# Patient Record
Sex: Female | Born: 1975 | Race: Black or African American | Hispanic: No | Marital: Single
Health system: Southern US, Community
[De-identification: ages and names within clinical notes are randomized; demographics above are authoritative.]

---

## 2004-04-21 ENCOUNTER — Emergency Department: Payer: Self-pay | Admitting: Emergency Medicine

## 2004-04-24 ENCOUNTER — Emergency Department (HOSPITAL_COMMUNITY): Admission: EM | Admit: 2004-04-24 | Discharge: 2004-04-24 | Payer: Self-pay | Admitting: Emergency Medicine

## 2004-05-24 ENCOUNTER — Emergency Department: Payer: Self-pay | Admitting: General Practice

## 2004-06-12 ENCOUNTER — Emergency Department: Payer: Self-pay | Admitting: Emergency Medicine

## 2004-06-19 ENCOUNTER — Emergency Department: Payer: Self-pay | Admitting: Emergency Medicine

## 2004-06-27 ENCOUNTER — Emergency Department (HOSPITAL_COMMUNITY): Admission: EM | Admit: 2004-06-27 | Discharge: 2004-06-28 | Payer: Self-pay | Admitting: Emergency Medicine

## 2004-09-04 ENCOUNTER — Other Ambulatory Visit: Payer: Self-pay

## 2004-09-04 ENCOUNTER — Inpatient Hospital Stay: Payer: Self-pay | Admitting: Internal Medicine

## 2004-11-29 ENCOUNTER — Emergency Department: Payer: Self-pay | Admitting: Emergency Medicine

## 2004-11-29 ENCOUNTER — Other Ambulatory Visit: Payer: Self-pay

## 2019-05-21 ENCOUNTER — Other Ambulatory Visit: Payer: Self-pay | Admitting: Obstetrics and Gynecology

## 2019-05-21 DIAGNOSIS — Z1231 Encounter for screening mammogram for malignant neoplasm of breast: Secondary | ICD-10-CM

## 2019-06-06 ENCOUNTER — Encounter (INDEPENDENT_AMBULATORY_CARE_PROVIDER_SITE_OTHER): Payer: Self-pay

## 2019-06-06 ENCOUNTER — Inpatient Hospital Stay: Payer: BLUE CROSS/BLUE SHIELD | Attending: Oncology | Admitting: Oncology

## 2019-06-06 ENCOUNTER — Other Ambulatory Visit: Payer: Self-pay

## 2019-06-06 ENCOUNTER — Inpatient Hospital Stay: Payer: BLUE CROSS/BLUE SHIELD

## 2019-06-06 VITALS — BP 132/79 | HR 73 | Temp 98.8°F | Wt 177.5 lb

## 2019-06-06 DIAGNOSIS — D509 Iron deficiency anemia, unspecified: Secondary | ICD-10-CM

## 2019-06-06 DIAGNOSIS — K5900 Constipation, unspecified: Secondary | ICD-10-CM | POA: Insufficient documentation

## 2019-06-06 LAB — CBC WITH DIFFERENTIAL/PLATELET
Abs Immature Granulocytes: 0.01 10*3/uL (ref 0.00–0.07)
Basophils Absolute: 0 10*3/uL (ref 0.0–0.1)
Basophils Relative: 0 %
Eosinophils Absolute: 0.1 10*3/uL (ref 0.0–0.5)
Eosinophils Relative: 2 %
HCT: 35.4 % — ABNORMAL LOW (ref 36.0–46.0)
Hemoglobin: 10.4 g/dL — ABNORMAL LOW (ref 12.0–15.0)
Immature Granulocytes: 0 %
Lymphocytes Relative: 39 %
Lymphs Abs: 2.1 10*3/uL (ref 0.7–4.0)
MCH: 23.9 pg — ABNORMAL LOW (ref 26.0–34.0)
MCHC: 29.4 g/dL — ABNORMAL LOW (ref 30.0–36.0)
MCV: 81.4 fL (ref 80.0–100.0)
Monocytes Absolute: 0.5 10*3/uL (ref 0.1–1.0)
Monocytes Relative: 9 %
Neutro Abs: 2.8 10*3/uL (ref 1.7–7.7)
Neutrophils Relative %: 50 %
Platelets: 281 10*3/uL (ref 150–400)
RBC: 4.35 MIL/uL (ref 3.87–5.11)
RDW: 15.3 % (ref 11.5–15.5)
WBC: 5.5 10*3/uL (ref 4.0–10.5)
nRBC: 0 % (ref 0.0–0.2)

## 2019-06-06 LAB — IRON AND TIBC
Iron: 46 ug/dL (ref 28–170)
Saturation Ratios: 11 % (ref 10.4–31.8)
TIBC: 428 ug/dL (ref 250–450)
UIBC: 382 ug/dL

## 2019-06-06 LAB — VITAMIN B12: Vitamin B-12: 537 pg/mL (ref 180–914)

## 2019-06-06 LAB — FOLATE: Folate: 6.3 ng/mL (ref >5.9)

## 2019-06-06 LAB — FERRITIN: Ferritin: 7 ng/mL — ABNORMAL LOW (ref 11–307)

## 2019-06-06 NOTE — Progress Notes (Signed)
[facility]. Questions and concerns gone over. Pt has no specific questions or concerns at this time.  

## 2019-06-07 DIAGNOSIS — D509 Iron deficiency anemia, unspecified: Secondary | ICD-10-CM | POA: Insufficient documentation

## 2019-06-07 NOTE — Progress Notes (Signed)
[facility]  Telephone:(336) [phone] [phone]) [phone]  ID: Charyl Dancer OB: [birth year]  MR#: [id number]  JKK#:[id number]  Patient Care Team: Schermerhorn, Ihor Austin, MD as PCP - General (Obstetrics and Gynecology)  CHIEF COMPLAINT: Iron deficiency anemia.  INTERVAL HISTORY: Patient is a 44 year old female was noted to have persistently decreased hemoglobin and ferritin levels.  She reports taking oral iron supplementation with no improvement of her blood work.  This also caused increased constipation.  She currently feels well and is asymptomatic.  She does not complain of weakness or fatigue.  She has no neurologic complaints.  She denies any recent fevers or illnesses.  She has a good appetite and denies weight loss.  She has no chest pain, shortness of breath, cough, or hemoptysis.  She denies any nausea, vomiting, constipation, or diarrhea.  She has no melena or hematochezia.  She has no urinary complaints.  Patient offers no specific complaints today.  REVIEW OF SYSTEMS:   Review of Systems  Constitutional: Negative.  Negative for fever, malaise/fatigue and weight loss.  Respiratory: Negative.  Negative for cough, hemoptysis and shortness of breath.   Cardiovascular: Negative.  Negative for chest pain and leg swelling.  Gastrointestinal: Negative.  Negative for abdominal pain, blood in stool and melena.  Genitourinary: Negative.  Negative for hematuria.  Musculoskeletal: Negative.  Negative for back pain.  Skin: Negative.  Negative for rash.  Neurological: Negative.  Negative for dizziness, focal weakness, weakness and headaches.  Psychiatric/Behavioral: Negative.  The patient is not nervous/anxious.     As per HPI. Otherwise, a complete review of systems is negative.  PAST MEDICAL HISTORY: No past medical history on file.  PAST SURGICAL HISTORY: No reported surgical history.  FAMILY HISTORY: No family history on file.  ADVANCED DIRECTIVES (Y/N):   N  HEALTH MAINTENANCE: Social History   Tobacco Use  . Smoking status: Not on file  Substance Use Topics  . Alcohol use: Not on file  . Drug use: Not on file     Colonoscopy:  PAP:  Bone density:  Lipid panel:  Allergies  Allergen Reactions  . Ceftriaxone Anaphylaxis    Current Outpatient Medications  Medication Sig Dispense Refill  . triamcinolone ointment (KENALOG) 0.1 % Apply topically 2 (two) times a week.     No current facility-administered medications for this visit.    OBJECTIVE: Vitals:   06/06/19 1341  BP: 132/79  Pulse: 73  Temp: 98.8 F (37.1 C)     There is no height or weight on file to calculate BMI.    ECOG FS:0 - Asymptomatic  General: Well-developed, well-nourished, no acute distress. Eyes: Pink conjunctiva, anicteric sclera. HEENT: Normocephalic, moist mucous membranes. Lungs: No audible wheezing or coughing. Heart: Regular rate and rhythm. Abdomen: Soft, nontender, no obvious distention. Musculoskeletal: No edema, cyanosis, or clubbing. Neuro: Alert, answering all questions appropriately. Cranial nerves grossly intact. Skin: No rashes or petechiae noted. Psych: Normal affect. Lymphatics: No cervical, calvicular, axillary or inguinal LAD.   LAB RESULTS:  No results found for: NA, K, CL, CO2, GLUCOSE, BUN, CREATININE, CALCIUM, PROT, ALBUMIN, AST, ALT, ALKPHOS, BILITOT, GFRNONAA, GFRAA  Lab Results  Component Value Date   WBC 5.5 06/06/2019   NEUTROABS 2.8 06/06/2019   HGB 10.4 (L) 06/06/2019   HCT 35.4 (L) 06/06/2019   MCV 81.4 06/06/2019   PLT 281 06/06/2019   Lab Results  Component Value Date   IRON 46 06/06/2019   TIBC 428 06/06/2019   IRONPCTSAT 11 06/06/2019  Lab Results  Component Value Date   FERRITIN 7 (L) 06/06/2019     STUDIES: No results found.  ASSESSMENT: Iron deficiency anemia.  PLAN:    1.  Iron deficiency anemia: Patient's hemoglobin and iron stores are persistently decreased and she has been  unresponsive to oral iron supplementation.  Patient return to clinic in 1 and 2 weeks to receive 510 mg IV Feraheme.  She would then return to clinic in 3 months with repeat laboratory work, further evaluation, and additional treatment if necessary.  I spent a total of 45 minutes reviewing chart data, face-to-face evaluation with the patient, counseling and coordination of care as detailed above.   Patient expressed understanding and was in agreement with this plan. She also understands that She can call clinic at any time with any questions, concerns, or complaints.    Lloyd Huger, MD   06/07/2019 9:01 AM

## 2019-06-10 LAB — HEMOGLOBINOPATHY EVALUATION
Hgb A2 Quant: 2 % (ref 1.8–3.2)
Hgb A: 98 % (ref 96.4–98.8)
Hgb C: 0 %
Hgb F Quant: 0 % (ref 0.0–2.0)
Hgb S Quant: 0 %
Hgb Variant: 0 %

## 2019-06-12 ENCOUNTER — Other Ambulatory Visit: Payer: Self-pay

## 2019-06-12 ENCOUNTER — Inpatient Hospital Stay: Payer: BLUE CROSS/BLUE SHIELD

## 2019-06-12 VITALS — BP 119/73 | HR 79 | Temp 97.5°F | Resp 18

## 2019-06-12 DIAGNOSIS — D509 Iron deficiency anemia, unspecified: Secondary | ICD-10-CM | POA: Diagnosis not present

## 2019-06-12 MED ORDER — SODIUM CHLORIDE 0.9 % IV SOLN
Freq: Once | INTRAVENOUS | Status: AC
  Filled 2019-06-12: qty 250

## 2019-06-12 MED ORDER — SODIUM CHLORIDE 0.9 % IV SOLN
510.0000 mg | Freq: Once | INTRAVENOUS | Status: AC
  Administered 2019-06-12: 510 mg via INTRAVENOUS
  Filled 2019-06-12: qty 17

## 2019-06-19 ENCOUNTER — Encounter: Payer: Self-pay | Admitting: Radiology

## 2019-06-19 ENCOUNTER — Ambulatory Visit
Admission: RE | Admit: 2019-06-19 | Discharge: 2019-06-19 | Disposition: A | Discharge status: Home / Self Care | Payer: BLUE CROSS/BLUE SHIELD | Source: Ambulatory Visit | Attending: Obstetrics and Gynecology | Admitting: Obstetrics and Gynecology

## 2019-06-19 DIAGNOSIS — Z1231 Encounter for screening mammogram for malignant neoplasm of breast: Secondary | ICD-10-CM | POA: Insufficient documentation

## 2019-06-20 ENCOUNTER — Inpatient Hospital Stay: Payer: BLUE CROSS/BLUE SHIELD

## 2019-06-20 ENCOUNTER — Other Ambulatory Visit: Payer: Self-pay

## 2019-06-20 VITALS — BP 116/70 | HR 79

## 2019-06-20 DIAGNOSIS — D509 Iron deficiency anemia, unspecified: Secondary | ICD-10-CM | POA: Diagnosis not present

## 2019-06-20 MED ORDER — SODIUM CHLORIDE 0.9 % IV SOLN
Freq: Once | INTRAVENOUS | Status: AC
  Filled 2019-06-20: qty 250

## 2019-06-20 MED ORDER — SODIUM CHLORIDE 0.9 % IV SOLN
510.0000 mg | Freq: Once | INTRAVENOUS | Status: AC
  Administered 2019-06-20: 510 mg via INTRAVENOUS
  Filled 2019-06-20: qty 17

## 2019-08-29 NOTE — Progress Notes (Deleted)
[facility]  [phone] [phone]) [phone]  ID: Cedric Fishman OB: [birth year]  MR#: [id number]  CHE#:[id number]  Patient Care Team: Schermerhorn, Gwen Her, MD as PCP - General (Obstetrics and Gynecology)  CHIEF COMPLAINT: Iron deficiency anemia.  INTERVAL HISTORY: Patient is a 44 year old female was noted to have persistently decreased hemoglobin and ferritin levels.  She reports taking oral iron supplementation with no improvement of her blood work.  This also caused increased constipation.  She currently feels well and is asymptomatic.  She does not complain of weakness or fatigue.  She has no neurologic complaints.  She denies any recent fevers or illnesses.  She has a good appetite and denies weight loss.  She has no chest pain, shortness of breath, cough, or hemoptysis.  She denies any nausea, vomiting, constipation, or diarrhea.  She has no melena or hematochezia.  She has no urinary complaints.  Patient offers no specific complaints today.  REVIEW OF SYSTEMS:   Review of Systems  Constitutional: Negative.  Negative for fever, malaise/fatigue and weight loss.  Respiratory: Negative.  Negative for cough, hemoptysis and shortness of breath.   Cardiovascular: Negative.  Negative for chest pain and leg swelling.  Gastrointestinal: Negative.  Negative for abdominal pain, blood in stool and melena.  Genitourinary: Negative.  Negative for hematuria.  Musculoskeletal: Negative.  Negative for back pain.  Skin: Negative.  Negative for rash.  Neurological: Negative.  Negative for dizziness, focal weakness, weakness and headaches.  Psychiatric/Behavioral: Negative.  The patient is not nervous/anxious.     As per HPI. Otherwise, a complete review of systems is negative.  PAST MEDICAL HISTORY: No past medical history on file.  PAST SURGICAL HISTORY: No reported surgical history.  FAMILY HISTORY: Family History  Problem Relation Age of Onset  . Breast  cancer Other 21    ADVANCED DIRECTIVES (Y/N):  N  HEALTH MAINTENANCE: Social History   Tobacco Use  . Smoking status: Not on file  Substance Use Topics  . Alcohol use: Not on file  . Drug use: Not on file     Colonoscopy:  PAP:  Bone density:  Lipid panel:  Allergies  Allergen Reactions  . Ceftriaxone Anaphylaxis    Current Outpatient Medications  Medication Sig Dispense Refill  . triamcinolone ointment (KENALOG) 0.1 % Apply topically 2 (two) times a week.     No current facility-administered medications for this visit.    OBJECTIVE: There were no vitals filed for this visit.   There is no height or weight on file to calculate BMI.    ECOG FS:0 - Asymptomatic  General: Well-developed, well-nourished, no acute distress. Eyes: Pink conjunctiva, anicteric sclera. HEENT: Normocephalic, moist mucous membranes. Lungs: No audible wheezing or coughing. Heart: Regular rate and rhythm. Abdomen: Soft, nontender, no obvious distention. Musculoskeletal: No edema, cyanosis, or clubbing. Neuro: Alert, answering all questions appropriately. Cranial nerves grossly intact. Skin: No rashes or petechiae noted. Psych: Normal affect. Lymphatics: No cervical, calvicular, axillary or inguinal LAD.   LAB RESULTS:  No results found for: NA, K, CL, CO2, GLUCOSE, BUN, CREATININE, CALCIUM, PROT, ALBUMIN, AST, ALT, ALKPHOS, BILITOT, GFRNONAA, GFRAA  Lab Results  Component Value Date   WBC 5.5 06/06/2019   NEUTROABS 2.8 06/06/2019   HGB 10.4 (L) 06/06/2019   HCT 35.4 (L) 06/06/2019   MCV 81.4 06/06/2019   PLT 281 06/06/2019   Lab Results  Component Value Date   IRON 46 06/06/2019   TIBC 428 06/06/2019   IRONPCTSAT 11 06/06/2019  Lab Results  Component Value Date   FERRITIN 7 (L) 06/06/2019     STUDIES: No results found.  ASSESSMENT: Iron deficiency anemia.  PLAN:    1.  Iron deficiency anemia: Patient's hemoglobin and iron stores are persistently decreased and she  has been unresponsive to oral iron supplementation.  Patient return to clinic in 1 and 2 weeks to receive 510 mg IV Feraheme.  She would then return to clinic in 3 months with repeat laboratory work, further evaluation, and additional treatment if necessary.  I spent a total of 45 minutes reviewing chart data, face-to-face evaluation with the patient, counseling and coordination of care as detailed above.   Patient expressed understanding and was in agreement with this plan. She also understands that She can call clinic at any time with any questions, concerns, or complaints.    Jeralyn Ruths, MD   08/29/2019 10:38 AM

## 2019-09-02 ENCOUNTER — Other Ambulatory Visit: Payer: Self-pay | Admitting: Emergency Medicine

## 2019-09-02 DIAGNOSIS — D509 Iron deficiency anemia, unspecified: Secondary | ICD-10-CM

## 2019-09-03 ENCOUNTER — Telehealth: Payer: Self-pay | Admitting: Oncology

## 2019-09-03 ENCOUNTER — Inpatient Hospital Stay: Payer: BLUE CROSS/BLUE SHIELD

## 2019-09-03 NOTE — Telephone Encounter (Signed)
Patient phoned on this date and left voicemail stating that she was out of town and would need to cancel her appts on 09-03-19 and 09-04-19. Patient stated in voicemail that she would phone back at a later time when she was in town to reschedule. Appts for 09-03-19 and 09-04-19 have been cancelled.

## 2019-09-04 ENCOUNTER — Inpatient Hospital Stay: Payer: BLUE CROSS/BLUE SHIELD | Admitting: Oncology

## 2019-09-04 ENCOUNTER — Inpatient Hospital Stay: Payer: BLUE CROSS/BLUE SHIELD

## 2019-09-04 DIAGNOSIS — D509 Iron deficiency anemia, unspecified: Secondary | ICD-10-CM

## 2019-10-17 ENCOUNTER — Encounter: Payer: Self-pay | Admitting: Oncology

## 2020-05-17 ENCOUNTER — Other Ambulatory Visit: Payer: Self-pay | Admitting: Student

## 2020-05-17 ENCOUNTER — Other Ambulatory Visit: Payer: Self-pay | Admitting: Obstetrics and Gynecology

## 2020-05-17 DIAGNOSIS — Z1231 Encounter for screening mammogram for malignant neoplasm of breast: Secondary | ICD-10-CM

## 2020-05-29 IMAGING — MG DIGITAL SCREENING BILAT W/ TOMO W/ CAD
6 of 10 series · 6 of 30 positions shown · non-contrast
Comparison: None.

CLINICAL DATA: Screening.

EXAM:
DIGITAL SCREENING BILATERAL MAMMOGRAM WITH TOMO AND CAD

[L CC synth-2D]
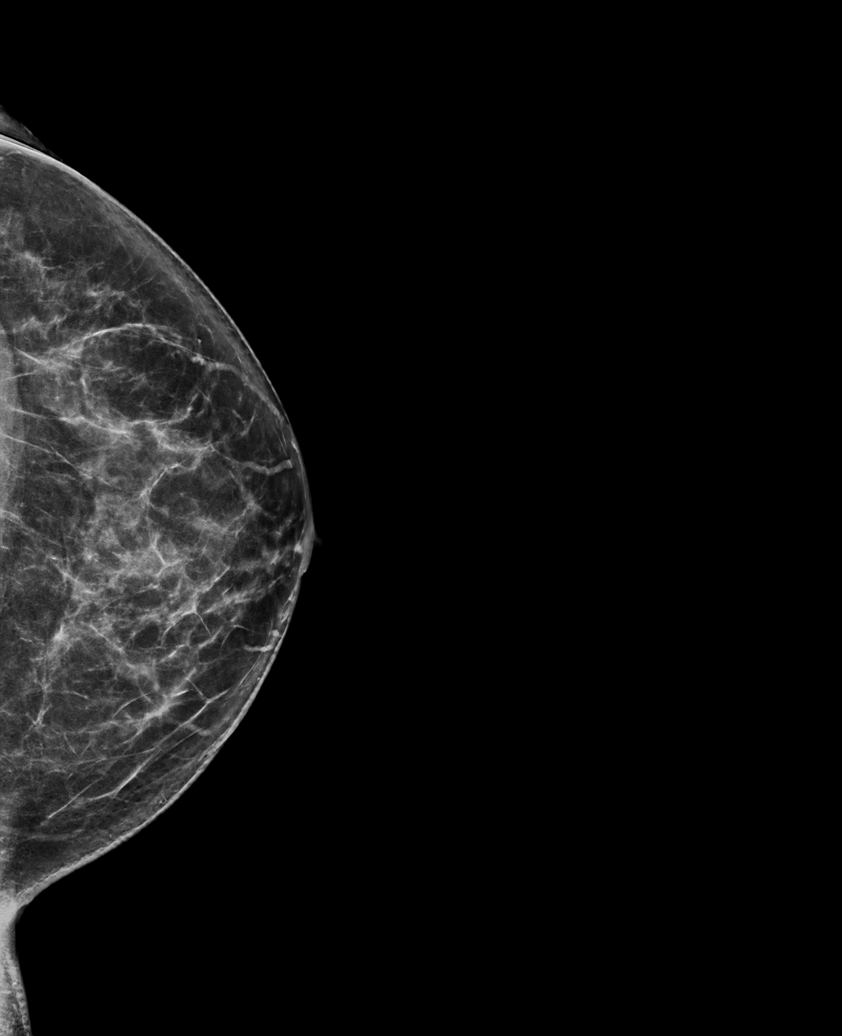

[L MLO synth-2D]
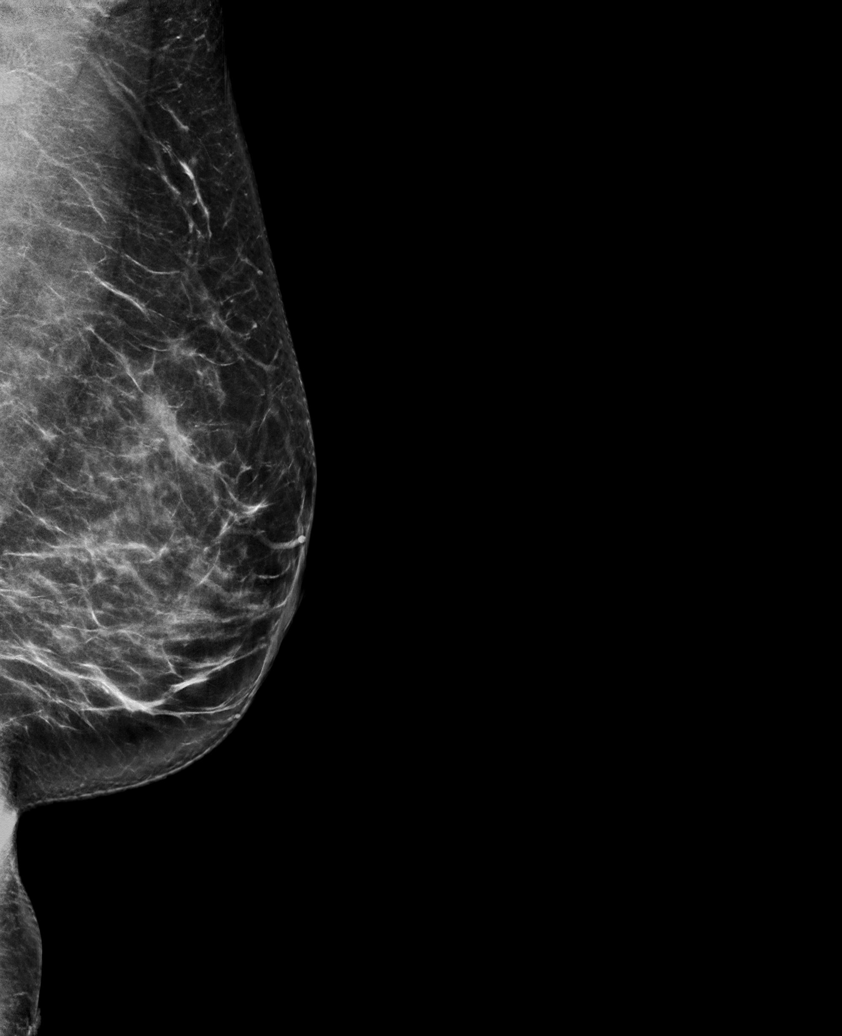

[R MLO synth-2D (1 of 2)]
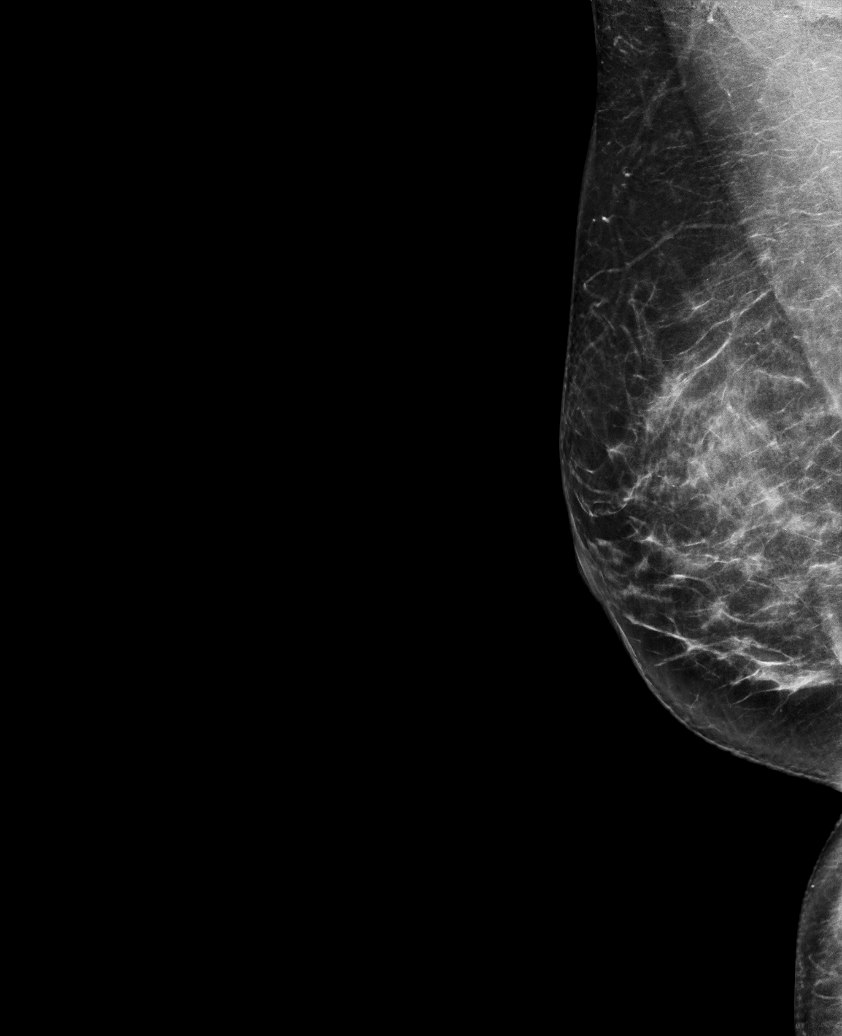

[R CC synth-2D]
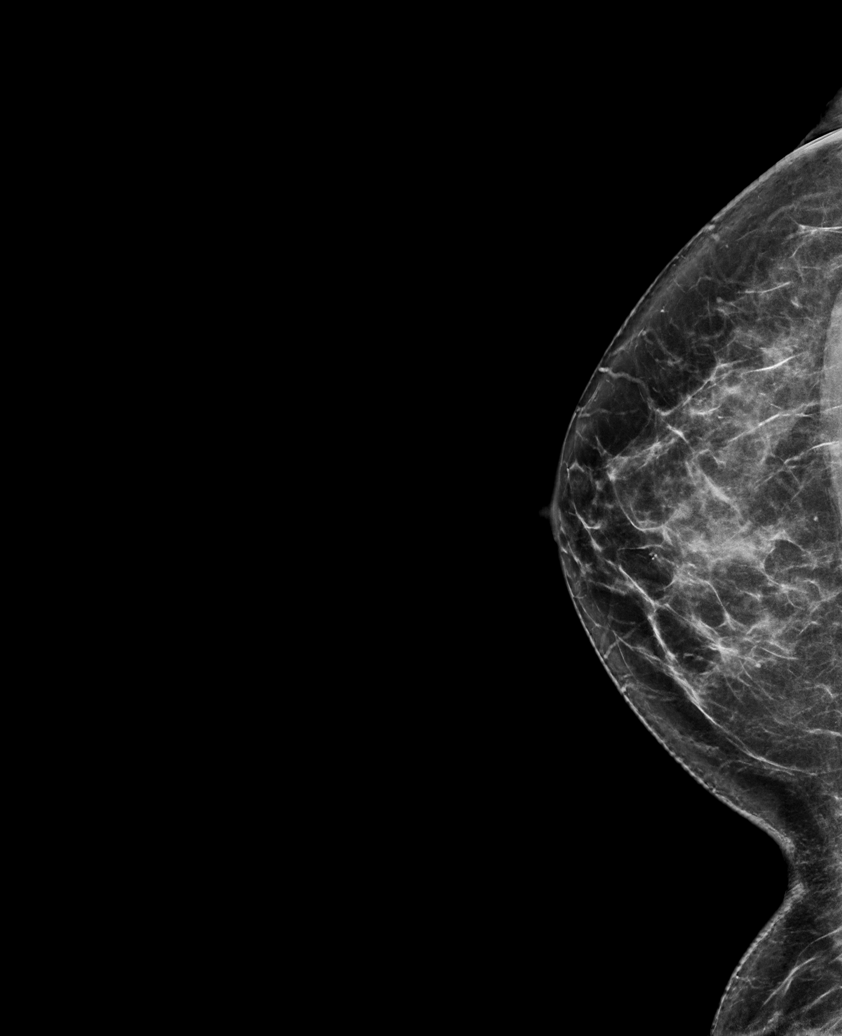

[R MLO synth-2D (2 of 2)]
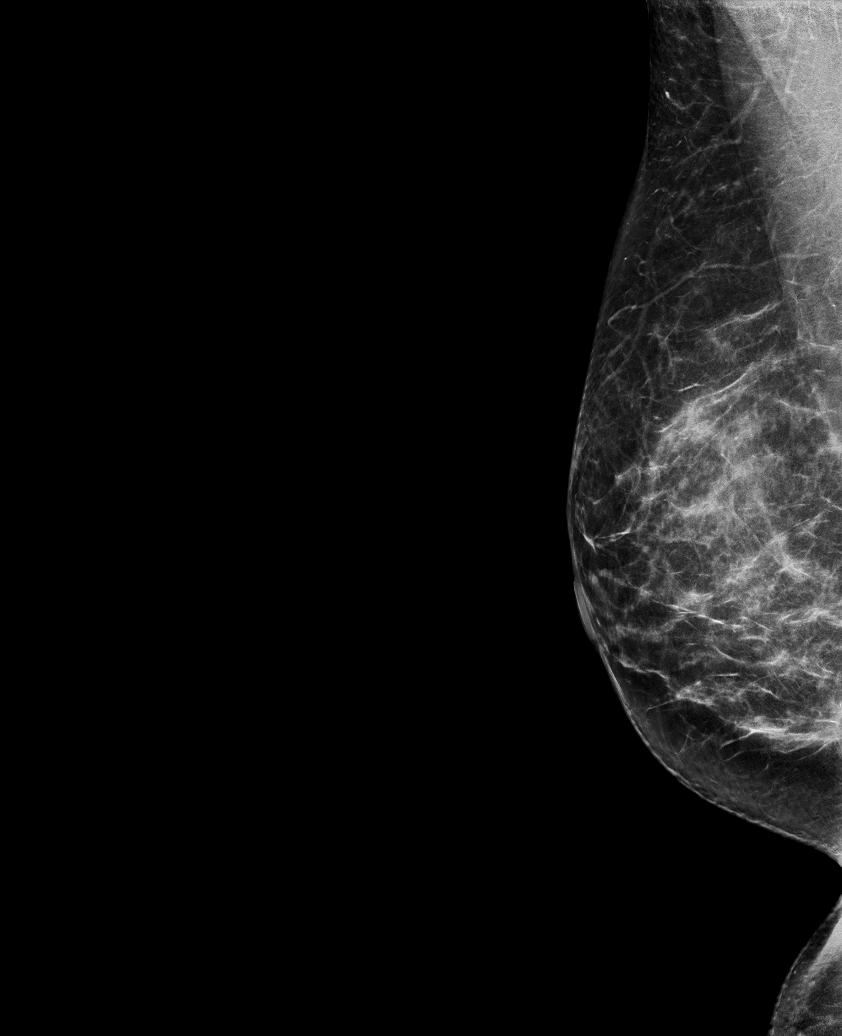

[R MLO tomo · tomo slice 39/78.0]
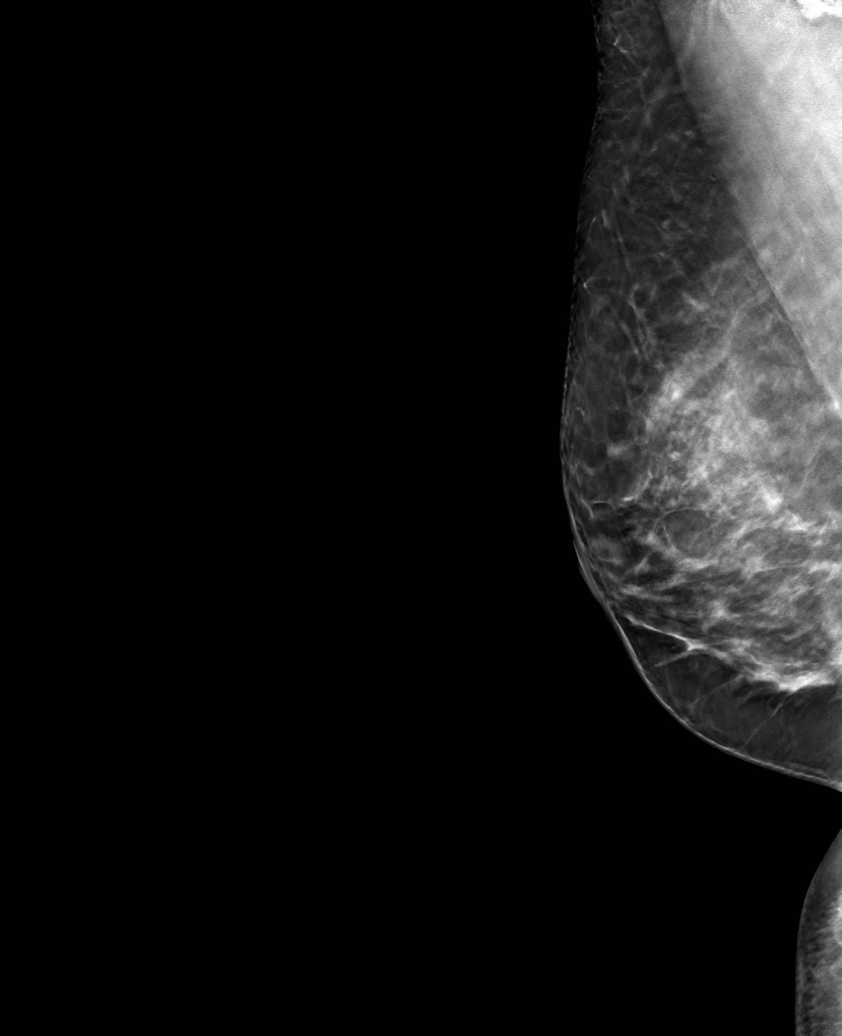

[6 of 30 positions shown; findings below may reference images not displayed]

ACR Breast Density Category c: The breast tissue is heterogeneously
dense, which may obscure small masses
FINDINGS: There are no findings suspicious for malignancy. Images were
processed with CAD.
IMPRESSION: No mammographic evidence of malignancy. A result letter of this
screening mammogram will be mailed directly to the patient.

RECOMMENDATION:
Screening mammogram in one year. (Code:EM-2-IHY)

BI-RADS CATEGORY  1: Negative.

## 2021-04-28 DIAGNOSIS — D509 Iron deficiency anemia, unspecified: Secondary | ICD-10-CM | POA: Diagnosis not present

## 2021-04-28 DIAGNOSIS — Z0000 Encounter for general adult medical examination without abnormal findings: Secondary | ICD-10-CM | POA: Diagnosis not present

## 2021-04-28 DIAGNOSIS — Z131 Encounter for screening for diabetes mellitus: Secondary | ICD-10-CM | POA: Diagnosis not present

## 2021-04-28 DIAGNOSIS — R519 Headache, unspecified: Secondary | ICD-10-CM | POA: Diagnosis not present

## 2021-04-28 DIAGNOSIS — G43109 Migraine with aura, not intractable, without status migrainosus: Secondary | ICD-10-CM | POA: Diagnosis not present

## 2021-04-28 DIAGNOSIS — Z23 Encounter for immunization: Secondary | ICD-10-CM | POA: Diagnosis not present

## 2021-04-28 DIAGNOSIS — Z1331 Encounter for screening for depression: Secondary | ICD-10-CM | POA: Diagnosis not present

## 2021-04-28 DIAGNOSIS — E782 Mixed hyperlipidemia: Secondary | ICD-10-CM | POA: Diagnosis not present

## 2021-05-21 DIAGNOSIS — J069 Acute upper respiratory infection, unspecified: Secondary | ICD-10-CM | POA: Diagnosis not present

## 2021-05-21 DIAGNOSIS — R5383 Other fatigue: Secondary | ICD-10-CM | POA: Diagnosis not present

## 2021-05-21 DIAGNOSIS — R051 Acute cough: Secondary | ICD-10-CM | POA: Diagnosis not present

## 2021-05-22 DIAGNOSIS — H53149 Visual discomfort, unspecified: Secondary | ICD-10-CM | POA: Diagnosis not present

## 2021-05-22 DIAGNOSIS — G43001 Migraine without aura, not intractable, with status migrainosus: Secondary | ICD-10-CM | POA: Diagnosis not present

## 2021-05-22 DIAGNOSIS — H1031 Unspecified acute conjunctivitis, right eye: Secondary | ICD-10-CM | POA: Diagnosis not present

## 2021-05-22 DIAGNOSIS — R059 Cough, unspecified: Secondary | ICD-10-CM | POA: Diagnosis not present

## 2021-05-22 DIAGNOSIS — J069 Acute upper respiratory infection, unspecified: Secondary | ICD-10-CM | POA: Diagnosis not present

## 2021-05-22 DIAGNOSIS — R519 Headache, unspecified: Secondary | ICD-10-CM | POA: Diagnosis not present

## 2021-06-06 DIAGNOSIS — G4733 Obstructive sleep apnea (adult) (pediatric): Secondary | ICD-10-CM | POA: Diagnosis not present

## 2021-06-27 DIAGNOSIS — Z01419 Encounter for gynecological examination (general) (routine) without abnormal findings: Secondary | ICD-10-CM | POA: Diagnosis not present

## 2021-06-27 DIAGNOSIS — Z1231 Encounter for screening mammogram for malignant neoplasm of breast: Secondary | ICD-10-CM | POA: Diagnosis not present

## 2021-07-04 DIAGNOSIS — G4733 Obstructive sleep apnea (adult) (pediatric): Secondary | ICD-10-CM | POA: Diagnosis not present

## 2021-08-04 DIAGNOSIS — G4733 Obstructive sleep apnea (adult) (pediatric): Secondary | ICD-10-CM | POA: Diagnosis not present

## 2021-09-03 DIAGNOSIS — G4733 Obstructive sleep apnea (adult) (pediatric): Secondary | ICD-10-CM | POA: Diagnosis not present

## 2021-09-30 DIAGNOSIS — M25532 Pain in left wrist: Secondary | ICD-10-CM | POA: Diagnosis not present

## 2021-09-30 DIAGNOSIS — G43009 Migraine without aura, not intractable, without status migrainosus: Secondary | ICD-10-CM | POA: Diagnosis not present

## 2021-09-30 DIAGNOSIS — M2550 Pain in unspecified joint: Secondary | ICD-10-CM | POA: Diagnosis not present

## 2021-10-04 DIAGNOSIS — G4733 Obstructive sleep apnea (adult) (pediatric): Secondary | ICD-10-CM | POA: Diagnosis not present

## 2021-11-03 DIAGNOSIS — G4733 Obstructive sleep apnea (adult) (pediatric): Secondary | ICD-10-CM | POA: Diagnosis not present

## 2021-12-04 DIAGNOSIS — G4733 Obstructive sleep apnea (adult) (pediatric): Secondary | ICD-10-CM | POA: Diagnosis not present
